# Patient Record
Sex: Female | Born: 1935 | Race: White | Hispanic: No | Marital: Married | Smoking: Former smoker
Health system: Southern US, Community
[De-identification: ages and names within clinical notes are randomized; demographics above are authoritative.]

## PROBLEM LIST (undated history)

## (undated) DIAGNOSIS — E041 Nontoxic single thyroid nodule: Secondary | ICD-10-CM

## (undated) DIAGNOSIS — Z9849 Cataract extraction status, unspecified eye: Secondary | ICD-10-CM

## (undated) DIAGNOSIS — C50919 Malignant neoplasm of unspecified site of unspecified female breast: Secondary | ICD-10-CM

## (undated) DIAGNOSIS — E7800 Pure hypercholesterolemia, unspecified: Secondary | ICD-10-CM

## (undated) DIAGNOSIS — Z9889 Other specified postprocedural states: Secondary | ICD-10-CM

## (undated) DIAGNOSIS — J45909 Unspecified asthma, uncomplicated: Secondary | ICD-10-CM

## (undated) DIAGNOSIS — I10 Essential (primary) hypertension: Secondary | ICD-10-CM

## (undated) DIAGNOSIS — K219 Gastro-esophageal reflux disease without esophagitis: Secondary | ICD-10-CM

## (undated) DIAGNOSIS — Z9660 Presence of unspecified orthopedic joint implant: Secondary | ICD-10-CM

## (undated) DIAGNOSIS — J449 Chronic obstructive pulmonary disease, unspecified: Secondary | ICD-10-CM

## (undated) DIAGNOSIS — M8580 Other specified disorders of bone density and structure, unspecified site: Secondary | ICD-10-CM

## (undated) DIAGNOSIS — M1990 Unspecified osteoarthritis, unspecified site: Secondary | ICD-10-CM

## (undated) HISTORY — DX: Essential (primary) hypertension: I10

## (undated) HISTORY — DX: Presence of unspecified orthopedic joint implant: Z96.60

## (undated) HISTORY — DX: Gastro-esophageal reflux disease without esophagitis: K21.9

## (undated) HISTORY — DX: Other specified postprocedural states: Z98.890

## (undated) HISTORY — DX: Other specified disorders of bone density and structure, unspecified site: M85.80

## (undated) HISTORY — DX: Cataract extraction status, unspecified eye: Z98.49

## (undated) HISTORY — DX: Unspecified osteoarthritis, unspecified site: M19.90

## (undated) HISTORY — PX: APPENDECTOMY: SHX54

## (undated) HISTORY — DX: Nontoxic single thyroid nodule: E04.1

## (undated) HISTORY — DX: Unspecified asthma, uncomplicated: J45.909

## (undated) HISTORY — DX: Malignant neoplasm of unspecified site of unspecified female breast: C50.919

## (undated) HISTORY — DX: Chronic obstructive pulmonary disease, unspecified: J44.9

## (undated) HISTORY — PX: ABDOMINAL HYSTERECTOMY: SHX81

## (undated) HISTORY — DX: Pure hypercholesterolemia, unspecified: E78.00

---

## 2007-06-15 ENCOUNTER — Ambulatory Visit: Payer: Self-pay | Admitting: Internal Medicine

## 2008-07-12 ENCOUNTER — Ambulatory Visit: Payer: Self-pay | Admitting: Family Medicine

## 2008-07-18 ENCOUNTER — Ambulatory Visit: Payer: Self-pay | Admitting: Family Medicine

## 2008-07-25 ENCOUNTER — Ambulatory Visit: Payer: Self-pay | Admitting: Cardiology

## 2008-07-25 ENCOUNTER — Ambulatory Visit: Payer: Self-pay | Admitting: Unknown Physician Specialty

## 2008-08-02 ENCOUNTER — Ambulatory Visit: Payer: Self-pay | Admitting: Unknown Physician Specialty

## 2008-08-05 ENCOUNTER — Inpatient Hospital Stay: Payer: Self-pay | Admitting: Vascular Surgery

## 2008-09-14 HISTORY — PX: BREAST EXCISIONAL BIOPSY: SUR124

## 2008-09-20 ENCOUNTER — Ambulatory Visit: Payer: Self-pay | Admitting: Vascular Surgery

## 2008-09-27 ENCOUNTER — Ambulatory Visit: Payer: Self-pay | Admitting: Family Medicine

## 2009-05-02 ENCOUNTER — Ambulatory Visit: Payer: Self-pay | Admitting: Gastroenterology

## 2009-08-06 ENCOUNTER — Ambulatory Visit: Payer: Self-pay | Admitting: Family Medicine

## 2009-08-22 ENCOUNTER — Ambulatory Visit: Payer: Self-pay | Admitting: Family Medicine

## 2009-10-13 IMAGING — CR DG CHEST 1V PORT
1 series · 1 of 1 positions shown · non-contrast
Comparison: none

REASON FOR EXAM: post central line
COMMENTS:

[view not recorded]
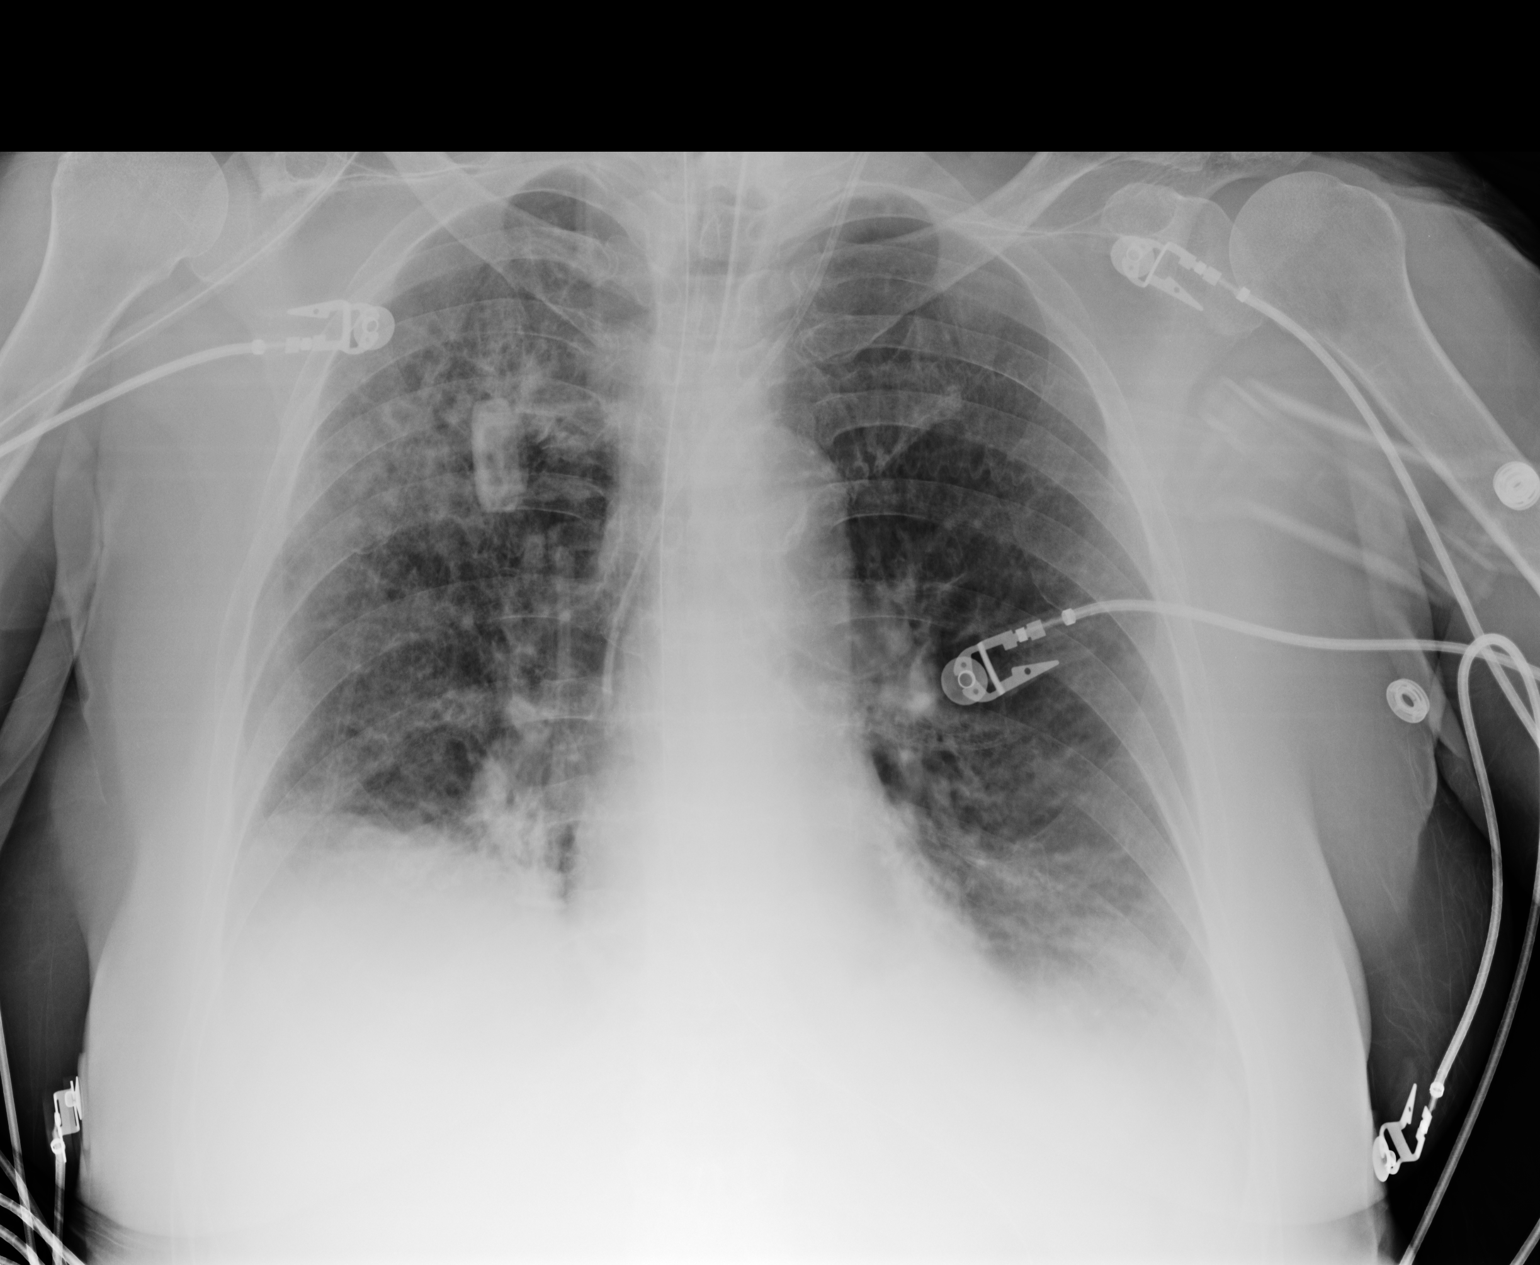

[1 of 1 positions shown; findings below may reference images not displayed]

PROCEDURE:     DXR - DXR PORTABLE CHEST SINGLE VIEW  - August 10, 2008  [DATE]

RESULT:     Comparison is made to prior study same date earlier time. In the
interim an endotracheal tube has been placed with tip at the level of the
clavicles. A LEFT sided central venous catheter is appreciated with tip
projected in the region of the superior vena cava. An NG tube is seen with
tip not on the view of this study.  There is diffuse thickening of the
interstitial markings as well as peribronchial cuffing. There appears to be
pulmonary opacities within the RIGHT hemithorax and to a lesser extent the
LEFT. Areas of increased density project within the RIGHT and LEFT lung
bases with blunting of the costophrenic angles. The cardiac silhouette is
within normal limits.
IMPRESSION: 1. Diffuse pulmonary infiltrate as well as possibly an element of
non-cardiogenic pulmonary edema. Support lines and tubes as described above.
Atelectasis versus infiltrate versus asymmetric edema within the lung bases.
 Continued surveillance evaluation is recommended.

2. No evidence of pneumothorax status post central venous catheter placement.

## 2009-10-14 IMAGING — CR DG CHEST 1V PORT
1 series · 1 of 1 positions shown · non-contrast
Comparison: none

REASON FOR EXAM: vent
COMMENTS:

[view not recorded]
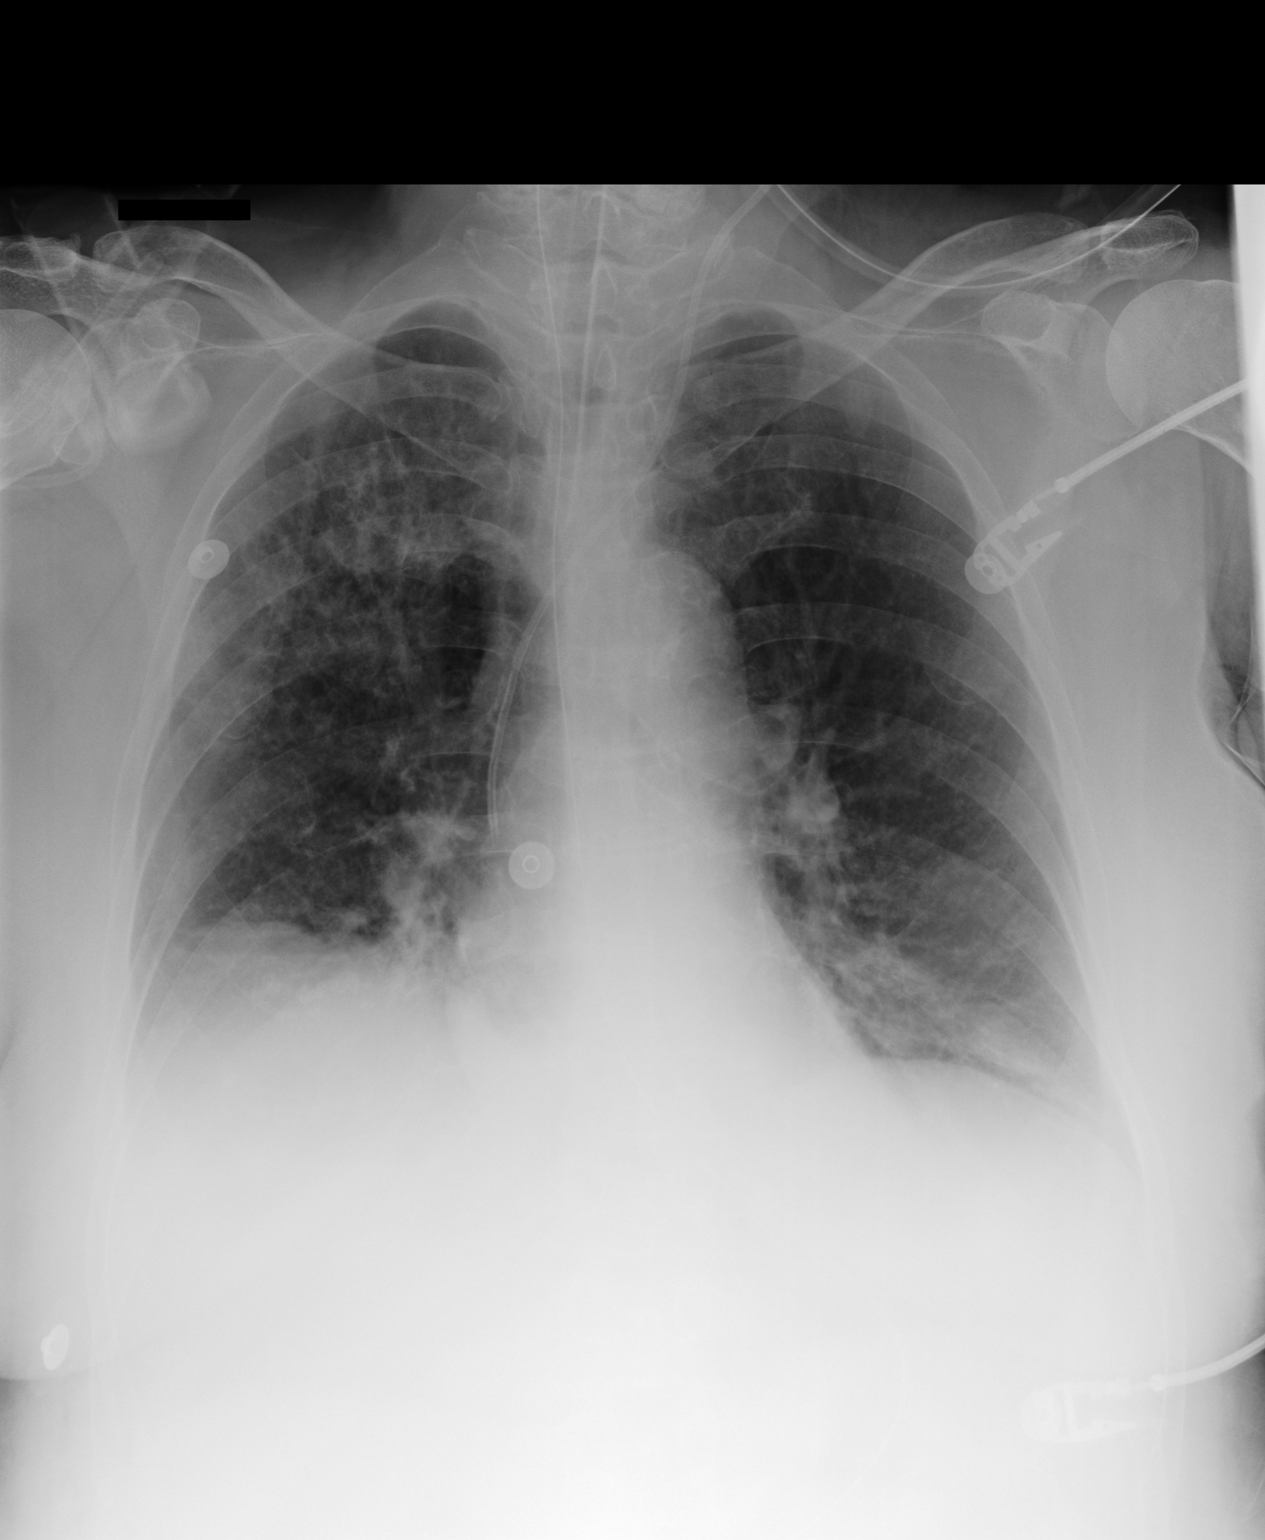

[1 of 1 positions shown; findings below may reference images not displayed]

PROCEDURE:     DXR - DXR PORTABLE CHEST SINGLE VIEW  - August 11, 2008  [DATE]

RESULT:     Frontal view of the chest is obtained.

Comparison is made to a prior study dated 08/10/2008.

There is persistent diffuse thickening of the interstitial markings and
pulmonary opacities within the RIGHT upper lobe which appear to have
slightly decreased in conspicuity when compared to the previous study and
likely secondary to improved aeration. No new focal regions of consolidation
are appreciated. A LEFT sided central venous catheter is appreciated with
the tip in the region of the superior vena cava/RIGHT atrial junction. NG
tube is seen with the tip not on the view of the study. Endotracheal tube is
seen with the tip at the level of the clavicles.
IMPRESSION: 1.     Slight decreased conspicuity of the bilateral pulmonary opacities
likely secondary to improved aeration. No new focal regions of consolidation
or focal infiltrates are appreciated.
2.     Support lines and tubes as described above.

## 2009-10-15 ENCOUNTER — Ambulatory Visit: Payer: Self-pay | Admitting: Radiation Oncology

## 2009-11-04 ENCOUNTER — Ambulatory Visit: Payer: Self-pay | Admitting: Radiation Oncology

## 2009-11-12 ENCOUNTER — Ambulatory Visit: Payer: Self-pay | Admitting: Radiation Oncology

## 2009-11-14 ENCOUNTER — Ambulatory Visit: Payer: Self-pay | Admitting: Radiation Oncology

## 2009-12-13 ENCOUNTER — Ambulatory Visit: Payer: Self-pay | Admitting: Radiation Oncology

## 2010-01-12 ENCOUNTER — Ambulatory Visit: Payer: Self-pay | Admitting: Radiation Oncology

## 2010-02-12 ENCOUNTER — Ambulatory Visit: Payer: Self-pay | Admitting: Radiation Oncology

## 2010-02-12 ENCOUNTER — Ambulatory Visit: Payer: Self-pay | Admitting: Internal Medicine

## 2010-03-14 ENCOUNTER — Ambulatory Visit: Payer: Self-pay | Admitting: Radiation Oncology

## 2010-03-14 ENCOUNTER — Ambulatory Visit: Payer: Self-pay | Admitting: Internal Medicine

## 2010-03-28 LAB — CANCER ANTIGEN 27.29: CA 27.29: 9.7 U/mL (ref 0.0–38.6)

## 2010-04-14 ENCOUNTER — Ambulatory Visit: Payer: Self-pay | Admitting: Internal Medicine

## 2010-04-30 ENCOUNTER — Encounter: Payer: Self-pay | Admitting: Family Medicine

## 2010-05-01 ENCOUNTER — Ambulatory Visit: Payer: Self-pay | Admitting: Radiation Oncology

## 2010-05-15 ENCOUNTER — Ambulatory Visit: Payer: Self-pay | Admitting: Radiation Oncology

## 2010-05-15 ENCOUNTER — Encounter: Payer: Self-pay | Admitting: Family Medicine

## 2010-07-16 ENCOUNTER — Encounter: Payer: Self-pay | Admitting: Orthopedic Surgery

## 2010-07-30 ENCOUNTER — Ambulatory Visit: Payer: Self-pay | Admitting: Radiation Oncology

## 2010-08-14 ENCOUNTER — Ambulatory Visit: Payer: Self-pay | Admitting: Radiation Oncology

## 2010-08-14 ENCOUNTER — Encounter: Payer: Self-pay | Admitting: Orthopedic Surgery

## 2010-08-15 LAB — CANCER ANTIGEN 27.29: CA 27.29: 13.4 U/mL (ref 0.0–38.6)

## 2010-09-14 ENCOUNTER — Ambulatory Visit: Payer: Self-pay | Admitting: Radiation Oncology

## 2010-10-28 ENCOUNTER — Ambulatory Visit: Payer: Self-pay | Admitting: Internal Medicine

## 2010-11-24 ENCOUNTER — Encounter: Payer: Self-pay | Admitting: Family Medicine

## 2010-12-14 ENCOUNTER — Encounter: Payer: Self-pay | Admitting: Family Medicine

## 2010-12-24 ENCOUNTER — Ambulatory Visit: Payer: Self-pay | Admitting: Internal Medicine

## 2011-01-13 ENCOUNTER — Ambulatory Visit: Payer: Self-pay | Admitting: Internal Medicine

## 2011-02-05 ENCOUNTER — Ambulatory Visit: Payer: Self-pay | Admitting: Family Medicine

## 2011-04-28 ENCOUNTER — Ambulatory Visit: Payer: Self-pay | Admitting: Internal Medicine

## 2011-05-16 ENCOUNTER — Ambulatory Visit: Payer: Self-pay | Admitting: Internal Medicine

## 2011-06-15 ENCOUNTER — Ambulatory Visit: Payer: Self-pay | Admitting: Internal Medicine

## 2011-08-21 ENCOUNTER — Encounter: Payer: Self-pay | Admitting: Family Medicine

## 2011-09-19 ENCOUNTER — Ambulatory Visit: Payer: Self-pay

## 2011-09-23 ENCOUNTER — Emergency Department: Payer: Self-pay | Admitting: Emergency Medicine

## 2011-09-23 LAB — COMPREHENSIVE METABOLIC PANEL
Albumin: 4.2 g/dL (ref 3.4–5.0)
Anion Gap: 11 (ref 7–16)
Calcium, Total: 9.8 mg/dL (ref 8.5–10.1)
Chloride: 91 mmol/L — ABNORMAL LOW (ref 98–107)
Creatinine: 0.59 mg/dL — ABNORMAL LOW (ref 0.60–1.30)
EGFR (African American): >60
Glucose: 119 mg/dL — ABNORMAL HIGH (ref 65–99)
Potassium: 3.7 mmol/L (ref 3.5–5.1)
Sodium: 131 mmol/L — ABNORMAL LOW (ref 136–145)
Total Protein: 7.8 g/dL (ref 6.4–8.2)

## 2011-09-23 LAB — CBC
HCT: 45.4 % (ref 35.0–47.0)
MCH: 33.4 pg (ref 26.0–34.0)
MCHC: 33.8 g/dL (ref 32.0–36.0)
MCV: 99 fL (ref 80–100)
Platelet: 276 10*3/uL (ref 150–440)
RBC: 4.6 10*6/uL (ref 3.80–5.20)
RDW: 12.6 % (ref 11.5–14.5)
WBC: 11 10*3/uL (ref 3.6–11.0)

## 2011-09-23 LAB — CK TOTAL AND CKMB (NOT AT ARMC): CK, Total: 65 U/L (ref 21–215)

## 2011-09-29 LAB — CULTURE, BLOOD (SINGLE)

## 2011-10-06 ENCOUNTER — Encounter: Payer: Self-pay | Admitting: Family Medicine

## 2011-11-12 ENCOUNTER — Ambulatory Visit: Payer: Self-pay | Admitting: Oncology

## 2011-11-12 LAB — CBC CANCER CENTER
Basophil %: 0.9 %
Eosinophil %: 3.6 %
HCT: 42.2 % (ref 35.0–47.0)
HGB: 14.4 g/dL (ref 12.0–16.0)
Lymphocyte #: 1.2 x10 3/mm (ref 1.0–3.6)
Lymphocyte %: 15.1 %
MCH: 32.7 pg (ref 26.0–34.0)
Monocyte %: 10.1 %
Neutrophil %: 70.3 %
RBC: 4.41 10*6/uL (ref 3.80–5.20)
WBC: 8.1 x10 3/mm (ref 3.6–11.0)

## 2011-11-12 LAB — COMPREHENSIVE METABOLIC PANEL
Albumin: 4.1 g/dL (ref 3.4–5.0)
Alkaline Phosphatase: 78 U/L (ref 50–136)
BUN: 16 mg/dL (ref 7–18)
Bilirubin,Total: 0.3 mg/dL (ref 0.2–1.0)
Chloride: 97 mmol/L — ABNORMAL LOW (ref 98–107)
Creatinine: 0.66 mg/dL (ref 0.60–1.30)
EGFR (Non-African Amer.): >60
Glucose: 97 mg/dL (ref 65–99)
Osmolality: 271 (ref 275–301)
SGOT(AST): 19 U/L (ref 15–37)
SGPT (ALT): 15 U/L
Total Protein: 7.6 g/dL (ref 6.4–8.2)

## 2011-11-13 ENCOUNTER — Ambulatory Visit: Payer: Self-pay | Admitting: Oncology

## 2011-12-14 ENCOUNTER — Ambulatory Visit: Payer: Self-pay | Admitting: Oncology

## 2012-03-03 ENCOUNTER — Ambulatory Visit: Payer: Self-pay | Admitting: Oncology

## 2012-06-10 ENCOUNTER — Ambulatory Visit: Payer: Self-pay | Admitting: Oncology

## 2012-06-11 LAB — CANCER ANTIGEN 27.29: CA 27.29: 8.8 U/mL (ref 0.0–38.6)

## 2012-06-14 ENCOUNTER — Ambulatory Visit: Payer: Self-pay | Admitting: Oncology

## 2012-09-14 DIAGNOSIS — Z9660 Presence of unspecified orthopedic joint implant: Secondary | ICD-10-CM

## 2012-09-14 HISTORY — DX: Presence of unspecified orthopedic joint implant: Z96.60

## 2012-11-21 ENCOUNTER — Ambulatory Visit: Payer: Self-pay | Admitting: Oncology

## 2012-11-24 ENCOUNTER — Ambulatory Visit: Payer: Self-pay | Admitting: Oncology

## 2012-11-25 LAB — CBC CANCER CENTER
Basophil %: 1.2 %
Eosinophil %: 4.5 %
HCT: 35.6 % (ref 35.0–47.0)
Lymphocyte #: 1.6 x10 3/mm (ref 1.0–3.6)
Lymphocyte %: 18.6 %
MCH: 28.7 pg (ref 26.0–34.0)
MCHC: 32.6 g/dL (ref 32.0–36.0)
MCV: 88 fL (ref 80–100)
Monocyte #: 1 x10 3/mm — ABNORMAL HIGH (ref 0.2–0.9)
Neutrophil %: 64.2 %
Platelet: 318 x10 3/mm (ref 150–440)
RBC: 4.05 10*6/uL (ref 3.80–5.20)
WBC: 8.9 x10 3/mm (ref 3.6–11.0)

## 2012-11-25 LAB — PROTIME-INR: INR: 1

## 2012-12-13 ENCOUNTER — Ambulatory Visit: Payer: Self-pay | Admitting: Oncology

## 2013-03-06 ENCOUNTER — Ambulatory Visit: Payer: Self-pay | Admitting: Oncology

## 2013-05-10 ENCOUNTER — Ambulatory Visit: Payer: Self-pay | Admitting: Oncology

## 2013-05-11 LAB — CANCER ANTIGEN 27.29: CA 27.29: 8.8 U/mL (ref 0.0–38.6)

## 2013-05-15 ENCOUNTER — Ambulatory Visit: Payer: Self-pay | Admitting: Oncology

## 2013-12-14 ENCOUNTER — Ambulatory Visit: Payer: Self-pay | Admitting: Oncology

## 2013-12-15 ENCOUNTER — Ambulatory Visit: Payer: Self-pay | Admitting: Oncology

## 2013-12-25 ENCOUNTER — Ambulatory Visit: Payer: Self-pay | Admitting: General Surgery

## 2014-01-15 ENCOUNTER — Ambulatory Visit: Payer: Self-pay | Admitting: Oncology

## 2014-01-16 LAB — PATHOLOGY REPORT

## 2014-01-18 HISTORY — PX: BREAST BIOPSY: SHX20

## 2014-01-25 ENCOUNTER — Ambulatory Visit: Payer: Self-pay | Admitting: Oncology

## 2014-01-26 LAB — CANCER ANTIGEN 27.29: CA 27.29: 13.5 U/mL (ref 0.0–38.6)

## 2014-02-12 ENCOUNTER — Ambulatory Visit: Payer: Self-pay | Admitting: Oncology

## 2014-03-14 ENCOUNTER — Ambulatory Visit: Payer: Self-pay | Admitting: Oncology

## 2014-08-08 ENCOUNTER — Ambulatory Visit: Payer: Self-pay | Admitting: Oncology

## 2014-08-08 LAB — HEPATIC FUNCTION PANEL A (ARMC)
AST: 19 U/L (ref 15–37)
Albumin: 3.9 g/dL (ref 3.4–5.0)
Alkaline Phosphatase: 52 U/L
BILIRUBIN TOTAL: 0.4 mg/dL (ref 0.2–1.0)
Bilirubin, Direct: 0.1 mg/dL (ref 0.0–0.2)
SGPT (ALT): 10 U/L — ABNORMAL LOW
TOTAL PROTEIN: 7 g/dL (ref 6.4–8.2)

## 2014-08-08 LAB — LACTATE DEHYDROGENASE: LDH: 158 U/L (ref 81–246)

## 2014-08-13 LAB — CANCER ANTIGEN 27.29: CA 27.29: <3.5 U/mL (ref 0.0–38.6)

## 2014-08-14 ENCOUNTER — Ambulatory Visit: Payer: Self-pay | Admitting: Oncology

## 2014-11-07 DIAGNOSIS — Z9889 Other specified postprocedural states: Secondary | ICD-10-CM

## 2014-11-07 HISTORY — DX: Other specified postprocedural states: Z98.890

## 2014-12-25 ENCOUNTER — Ambulatory Visit
Admit: 2014-12-25 | Disposition: A | Discharge status: Home / Self Care | Payer: Self-pay | Attending: Otolaryngology | Admitting: Otolaryngology

## 2015-01-22 ENCOUNTER — Other Ambulatory Visit: Payer: Self-pay | Admitting: Oncology

## 2015-01-22 DIAGNOSIS — C50919 Malignant neoplasm of unspecified site of unspecified female breast: Secondary | ICD-10-CM | POA: Insufficient documentation

## 2015-01-22 DIAGNOSIS — C50912 Malignant neoplasm of unspecified site of left female breast: Secondary | ICD-10-CM

## 2015-01-25 ENCOUNTER — Ambulatory Visit: Payer: Self-pay | Admitting: Oncology

## 2015-01-25 ENCOUNTER — Other Ambulatory Visit: Payer: Self-pay

## 2015-02-01 ENCOUNTER — Inpatient Hospital Stay (HOSPITAL_BASED_OUTPATIENT_CLINIC_OR_DEPARTMENT_OTHER): Payer: Medicare Other | Admitting: Oncology

## 2015-02-01 ENCOUNTER — Inpatient Hospital Stay: Payer: Medicare Other | Attending: Oncology

## 2015-02-01 ENCOUNTER — Encounter: Payer: Self-pay | Admitting: Oncology

## 2015-02-01 VITALS — BP 191/83 | HR 82 | Temp 97.0°F | Resp 18 | Wt 132.7 lb

## 2015-02-01 DIAGNOSIS — Z79899 Other long term (current) drug therapy: Secondary | ICD-10-CM | POA: Diagnosis not present

## 2015-02-01 DIAGNOSIS — Z170 Estrogen receptor positive status [ER+]: Secondary | ICD-10-CM

## 2015-02-01 DIAGNOSIS — I10 Essential (primary) hypertension: Secondary | ICD-10-CM | POA: Diagnosis not present

## 2015-02-01 DIAGNOSIS — J449 Chronic obstructive pulmonary disease, unspecified: Secondary | ICD-10-CM | POA: Insufficient documentation

## 2015-02-01 DIAGNOSIS — Z87891 Personal history of nicotine dependence: Secondary | ICD-10-CM | POA: Insufficient documentation

## 2015-02-01 DIAGNOSIS — C50912 Malignant neoplasm of unspecified site of left female breast: Secondary | ICD-10-CM

## 2015-02-01 DIAGNOSIS — K219 Gastro-esophageal reflux disease without esophagitis: Secondary | ICD-10-CM | POA: Diagnosis not present

## 2015-02-01 DIAGNOSIS — Z79811 Long term (current) use of aromatase inhibitors: Secondary | ICD-10-CM | POA: Diagnosis not present

## 2015-02-01 DIAGNOSIS — M818 Other osteoporosis without current pathological fracture: Secondary | ICD-10-CM

## 2015-02-01 DIAGNOSIS — E780 Pure hypercholesterolemia: Secondary | ICD-10-CM | POA: Diagnosis not present

## 2015-02-02 LAB — CANCER ANTIGEN 27.29: CA 27.29: 19 U/mL (ref 0.0–38.6)

## 2015-02-15 NOTE — Progress Notes (Signed)
[place]  [phone]) [phone] [phone]) [phone]  ID: Amanda Calhoun OB: Mar 15, 1935  MR#: [id number]  XGZ#:[id number]  No care team member to display  CHIEF COMPLAINT:  Chief Complaint  Patient presents with  . Follow-up    breast cancer    INTERVAL HISTORY: Patient returns to clinic today for routine 6 month evaluation.  She continues to feel well and remains asymptomatic.  She continues to tolerate tamoxifen without significant side effects.  She has had no recent fevers or illnesses.  She denies any weight loss.  She denies any pain.  She has no neurologic complaints.  She denies any chest pain or shortness of breath.  She denies any nausea, vomiting, constipation, or diarrhea.  She has no urinary complaints.  Patient offers no specific complaints today.   REVIEW OF SYSTEMS:   Review of Systems  Constitutional: Negative.     As per HPI. Otherwise, a complete review of systems is negatve.  PAST MEDICAL HISTORY: Past Medical History  Diagnosis Date  . GERD (gastroesophageal reflux disease)   . Hypertension   . COPD (chronic obstructive pulmonary disease)   . Breast cancer   . Pure hypercholesterolemia   . Arthritis   . Osteopenia   . Asthma   . Thyroid nodule   . History of back surgery     2012; 2015  . Hx of abdominal surgery     small intestine resection (gangrenous)  . Hx of joint replacement 2014  . H/O cataract extraction 04/2014, 05/2014  . H/O colonoscopy 11/07/14    PAST SURGICAL HISTORY: Past Surgical History  Procedure Laterality Date  . Appendectomy    . Abdominal hysterectomy      FAMILY HISTORY Family History  Problem Relation Age of Onset  . Dementia Mother   . Alzheimer's disease Mother   . Hypertension Mother   . Breast cancer Mother   . Hypertension Father   . Dementia Father   . Alzheimer's disease Father   . Breast cancer Sister        ADVANCED DIRECTIVES:    HEALTH MAINTENANCE: History  Substance  Use Topics  . Smoking status: Former Smoker    Types: Cigarettes    Quit date: 09/22/1986  . Smokeless tobacco: Never Used  . Alcohol Use: No     Colonoscopy:  PAP:  Bone density:  Lipid panel:  Allergies  Allergen Reactions  . Sulfa Antibiotics   . Penicillins Rash    'measles-like'    Current Outpatient Prescriptions  Medication Sig Dispense Refill  . alendronate (FOSAMAX) 70 MG tablet     . aspirin 81 MG chewable tablet Chew by mouth.    . budesonide-formoterol (SYMBICORT) 80-4.5 MCG/ACT inhaler Inhale into the lungs.    Neta Ehlers NATURAL ZINC 50 MG TABS Take by mouth.    Marland Kitchen lisinopril (PRINIVIL,ZESTRIL) 30 MG tablet     . lovastatin (MEVACOR) 40 MG tablet Take by mouth.    . naproxen sodium (ANAPROX) 220 MG tablet Take by mouth.    . oxybutynin (DITROPAN) 5 MG tablet     . pantoprazole (PROTONIX) 40 MG tablet     . SPIRIVA HANDIHALER 18 MCG inhalation capsule      No current facility-administered medications for this visit.    OBJECTIVE: Filed Vitals:   02/01/15 1200  BP: 191/83  Pulse: 82  Temp: 97 F (36.1 C)  Resp: 18     There is no height on file to calculate BMI.  ECOG FS:0 - Asymptomatic  General: Well-developed, well-nourished, no acute distress. Eyes: anicteric sclera. Breasts: Bilateral breast and axilla without lumps or masses. Lungs: Clear to auscultation bilaterally. Heart: Regular rate and rhythm. No rubs, murmurs, or gallops. Abdomen: Soft, nontender, nondistended. No organomegaly noted, normoactive bowel sounds. Musculoskeletal: No edema, cyanosis, or clubbing. Neuro: Alert, answering all questions appropriately. Cranial nerves grossly intact. Skin: No rashes or petechiae noted. Psych: Normal affect.  LAB RESULTS:  Lab Results  Component Value Date   NA 135* 11/12/2011   K 3.6 11/12/2011   CL 97* 11/12/2011   CO2 28 11/12/2011   GLUCOSE 97 11/12/2011   BUN 16 11/12/2011   CREATININE 0.66 11/12/2011   CALCIUM 9.2 11/12/2011   PROT  7.0 08/08/2014   ALBUMIN 3.9 08/08/2014   AST 19 08/08/2014   ALT 10* 08/08/2014   ALKPHOS 52 08/08/2014    Lab Results  Component Value Date   WBC 8.9 11/25/2012   NEUTROABS 5.7 11/25/2012   HGB 11.6* 11/25/2012   HCT 35.6 11/25/2012   MCV 88 11/25/2012   PLT 318 11/25/2012     STUDIES: No results found.  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  Breast cancer: No evidence of disease.  Continue tamoxifen completing 5 years of treatment in August 2016. She had a recent mammogram that was reported as BI-RADS 4, but follow up biopsy was negative.  No intervention was needed.  Repeat mammogram in May of 2016. Patient will return to clinic in 1 year routine evaluation.   2.  Osteoporosis: Bone mineral density in June 2015 revealed osteoporosisis but significantly improved over 1 year prior. Continue Fosamax, calcium, and vitamin D.  Continue monitoring by patient's PCP.  Patient expressed understanding and was in agreement with this plan. She also understands that She can call clinic at any time with any questions, concerns, or complaints.   Breast cancer   Staging form: Breast, AJCC 7th Edition     Clinical stage from 01/22/2015: Stage IA (T1a, N0, M0) - Signed by Lloyd Huger, MD on 01/22/2015   Lloyd Huger, MD   02/15/2015 4:14 PM

## 2015-08-14 ENCOUNTER — Ambulatory Visit
Admission: RE | Admit: 2015-08-14 | Discharge: 2015-08-14 | Disposition: A | Discharge status: Home / Self Care | Payer: Medicare Other | Source: Ambulatory Visit | Attending: Oncology | Admitting: Oncology

## 2015-08-14 ENCOUNTER — Other Ambulatory Visit: Payer: Self-pay | Admitting: Oncology

## 2015-08-14 DIAGNOSIS — C50912 Malignant neoplasm of unspecified site of left female breast: Secondary | ICD-10-CM

## 2015-08-14 DIAGNOSIS — Z853 Personal history of malignant neoplasm of breast: Secondary | ICD-10-CM | POA: Insufficient documentation

## 2015-12-21 ENCOUNTER — Emergency Department: Payer: Medicare Other

## 2015-12-21 ENCOUNTER — Emergency Department
Admission: EM | Admit: 2015-12-21 | Discharge: 2015-12-21 | Disposition: A | Discharge status: Home / Self Care | Payer: Medicare Other | Attending: Emergency Medicine | Admitting: Emergency Medicine

## 2015-12-21 ENCOUNTER — Encounter: Payer: Self-pay | Admitting: Emergency Medicine

## 2015-12-21 DIAGNOSIS — J449 Chronic obstructive pulmonary disease, unspecified: Secondary | ICD-10-CM | POA: Insufficient documentation

## 2015-12-21 DIAGNOSIS — Y999 Unspecified external cause status: Secondary | ICD-10-CM | POA: Diagnosis not present

## 2015-12-21 DIAGNOSIS — W01198A Fall on same level from slipping, tripping and stumbling with subsequent striking against other object, initial encounter: Secondary | ICD-10-CM | POA: Insufficient documentation

## 2015-12-21 DIAGNOSIS — Z7982 Long term (current) use of aspirin: Secondary | ICD-10-CM | POA: Insufficient documentation

## 2015-12-21 DIAGNOSIS — Z87891 Personal history of nicotine dependence: Secondary | ICD-10-CM | POA: Insufficient documentation

## 2015-12-21 DIAGNOSIS — I10 Essential (primary) hypertension: Secondary | ICD-10-CM | POA: Diagnosis not present

## 2015-12-21 DIAGNOSIS — K219 Gastro-esophageal reflux disease without esophagitis: Secondary | ICD-10-CM | POA: Diagnosis not present

## 2015-12-21 DIAGNOSIS — Y929 Unspecified place or not applicable: Secondary | ICD-10-CM | POA: Diagnosis not present

## 2015-12-21 DIAGNOSIS — Z853 Personal history of malignant neoplasm of breast: Secondary | ICD-10-CM | POA: Insufficient documentation

## 2015-12-21 DIAGNOSIS — Y93A1 Activity, exercise machines primarily for cardiorespiratory conditioning: Secondary | ICD-10-CM | POA: Insufficient documentation

## 2015-12-21 DIAGNOSIS — J45909 Unspecified asthma, uncomplicated: Secondary | ICD-10-CM | POA: Insufficient documentation

## 2015-12-21 DIAGNOSIS — R52 Pain, unspecified: Secondary | ICD-10-CM

## 2015-12-21 DIAGNOSIS — M545 Low back pain: Secondary | ICD-10-CM | POA: Diagnosis present

## 2015-12-21 DIAGNOSIS — M5136 Other intervertebral disc degeneration, lumbar region: Secondary | ICD-10-CM | POA: Diagnosis not present

## 2015-12-21 DIAGNOSIS — E7800 Pure hypercholesterolemia, unspecified: Secondary | ICD-10-CM | POA: Insufficient documentation

## 2015-12-21 DIAGNOSIS — R262 Difficulty in walking, not elsewhere classified: Secondary | ICD-10-CM

## 2015-12-21 DIAGNOSIS — W19XXXA Unspecified fall, initial encounter: Secondary | ICD-10-CM

## 2015-12-21 LAB — COMPREHENSIVE METABOLIC PANEL
ALK PHOS: 74 U/L (ref 38–126)
ALT: 14 U/L (ref 14–54)
AST: 26 U/L (ref 15–41)
Albumin: 3.6 g/dL (ref 3.5–5.0)
Anion gap: 4 — ABNORMAL LOW (ref 5–15)
BILIRUBIN TOTAL: 0.8 mg/dL (ref 0.3–1.2)
BUN: 10 mg/dL (ref 6–20)
CALCIUM: 9 mg/dL (ref 8.9–10.3)
CO2: 26 mmol/L (ref 22–32)
Chloride: 105 mmol/L (ref 101–111)
Creatinine, Ser: 0.51 mg/dL (ref 0.44–1.00)
GFR calc Af Amer: >60 mL/min (ref >60)
Glucose, Bld: 128 mg/dL — ABNORMAL HIGH (ref 65–99)
POTASSIUM: 4 mmol/L (ref 3.5–5.1)
Sodium: 135 mmol/L (ref 135–145)
TOTAL PROTEIN: 6.3 g/dL — AB (ref 6.5–8.1)

## 2015-12-21 LAB — CBC WITH DIFFERENTIAL/PLATELET
BASOS ABS: 0.1 10*3/uL (ref 0–0.1)
Basophils Relative: 1 %
Eosinophils Absolute: 0.4 10*3/uL (ref 0–0.7)
Eosinophils Relative: 4 %
HEMATOCRIT: 41.4 % (ref 35.0–47.0)
HEMOGLOBIN: 14.1 g/dL (ref 12.0–16.0)
LYMPHS PCT: 6 %
Lymphs Abs: 0.6 10*3/uL — ABNORMAL LOW (ref 1.0–3.6)
MCH: 32.8 pg (ref 26.0–34.0)
MCHC: 33.9 g/dL (ref 32.0–36.0)
MCV: 96.7 fL (ref 80.0–100.0)
MONO ABS: 0.8 10*3/uL (ref 0.2–0.9)
MONOS PCT: 8 %
NEUTROS ABS: 9 10*3/uL — AB (ref 1.4–6.5)
NEUTROS PCT: 81 %
Platelets: 167 10*3/uL (ref 150–440)
RBC: 4.28 MIL/uL (ref 3.80–5.20)
RDW: 12.8 % (ref 11.5–14.5)
WBC: 10.9 10*3/uL (ref 3.6–11.0)

## 2015-12-21 MED ORDER — PREDNISONE 10 MG (21) PO TBPK
ORAL_TABLET | ORAL | Status: AC
Start: 2015-12-21 — End: ?

## 2015-12-21 MED ORDER — DIAZEPAM 5 MG PO TABS: 5.0000 mg | ORAL_TABLET | Freq: Three times a day (TID) | ORAL | Status: AC | PRN

## 2015-12-21 MED ORDER — OXYCODONE HCL 5 MG PO TABS
5.0000 mg | ORAL_TABLET | Freq: Once | ORAL | Status: AC
  Administered 2015-12-21: 5 mg via ORAL
  Filled 2015-12-21: qty 1

## 2015-12-21 MED ORDER — OXYCODONE-ACETAMINOPHEN 7.5-325 MG PO TABS: 1.0000 | ORAL_TABLET | ORAL | Status: AC | PRN

## 2015-12-21 MED ORDER — DIAZEPAM 5 MG PO TABS
5.0000 mg | ORAL_TABLET | Freq: Once | ORAL | Status: AC
  Administered 2015-12-21: 5 mg via ORAL
  Filled 2015-12-21: qty 1

## 2015-12-21 MED ORDER — OXYCODONE-ACETAMINOPHEN 5-325 MG PO TABS
2.0000 | ORAL_TABLET | Freq: Once | ORAL | Status: AC
  Administered 2015-12-21: 2 via ORAL
  Filled 2015-12-21: qty 2

## 2015-12-21 MED ORDER — PREDNISONE 20 MG PO TABS
60.0000 mg | ORAL_TABLET | Freq: Once | ORAL | Status: AC
  Administered 2015-12-21: 60 mg via ORAL
  Filled 2015-12-21: qty 3

## 2015-12-21 NOTE — ED Notes (Signed)
Pt to ED via EMS c/o pain to lower back and sacrum/back of thighs resulting from a fall yesterday. Pt states she was exercising with a stretchy band, band broke and pt fell backward and hit her lower back on part of a treadmill. States pain has gotten worse today and she is unable to sit/stand. Pt states she has tramadol and flexeril at home and has taken them without relief.

## 2015-12-21 NOTE — ED Notes (Signed)
Patient returned from MRI.

## 2015-12-21 NOTE — ED Notes (Signed)
Discussed discharge instructions, prescriptions, and follow-up care with patient. No questions or concerns at this time. Pt stable at discharge. Pt's spouse notified of discharge per pt's request so he can meet EMS at home.

## 2015-12-21 NOTE — ED Notes (Signed)
Pt's spouse # [phone], asked to be called when pt is picked up by EMS.

## 2015-12-21 NOTE — ED Notes (Signed)
Patient transported to MRI 

## 2015-12-21 NOTE — ED Provider Notes (Signed)
[facility] Emergency Department Provider Note     Time seen: ----------------------------------------- 2:58 PM on 12/21/2015 -----------------------------------------    I have reviewed the triage vital signs and the nursing notes.   HISTORY  Chief Complaint Back Pain    HPI Amanda Calhoun is a 81 y.o. female who presents to ER after sustaining a fall on Thursday. Patient states she was using stretchy bands to exercise, the band broke and she fell backwards hitting her back and part of the treadmill. The pain has worsened, she can only walk on her tiptoes with a walker, she has radicular pain down the back of both legs to her knees. She denies any numbness, nausea and a loss of bowel or bladder function. She been taking tramadol and Flexeril home without any improvement. The pain is in her low back.   Past Medical History  Diagnosis Date  . GERD (gastroesophageal reflux disease)   . Hypertension   . COPD (chronic obstructive pulmonary disease) ([place])   . Breast cancer ([place])   . Pure hypercholesterolemia   . Arthritis   . Osteopenia   . Asthma   . Thyroid nodule   . History of back surgery     2012; 2015  . Hx of abdominal surgery     small intestine resection (gangrenous)  . Hx of joint replacement 2014  . H/O cataract extraction 04/2014, 05/2014  . H/O colonoscopy 11/07/14    Patient Active Problem List   Diagnosis Date Noted  . Breast cancer ([place]) 01/22/2015    Past Surgical History  Procedure Laterality Date  . Appendectomy    . Abdominal hysterectomy    . Breast excisional biopsy Left 2010    breast ca radiation  . Breast biopsy Left 01/18/2014    stereo fat necrosis     Allergies Sulfa antibiotics and Penicillins  Social History Social History  Substance Use Topics  . Smoking status: Former Smoker    Types: Cigarettes    Quit date: 09/22/1986  . Smokeless tobacco: Never Used  . Alcohol Use: No    Review of  Systems Constitutional: Negative for fever. Cardiovascular: Negative for chest pain. Respiratory: Negative for shortness of breath. Gastrointestinal: Negative for abdominal pain, vomiting and diarrhea. Genitourinary: Negative for dysuria. Musculoskeletal: Positive for low back pain Skin: Negative for rash. Neurological: Negative for headaches, focal weakness or numbness. Positive for difficulty walking  10-point ROS otherwise negative.  ____________________________________________   PHYSICAL EXAM:  VITAL SIGNS: ED Triage Vitals  Enc Vitals Group     BP 12/21/15 1428 167/78 mmHg     Pulse Rate 12/21/15 1428 90     Resp 12/21/15 1428 17     Temp 12/21/15 1428 98 F (36.7 C)     Temp Source 12/21/15 1428 Oral     SpO2 12/21/15 1428 96 %     Weight 12/21/15 1428 124 lb (56.246 kg)     Height 12/21/15 1428 5\' 4"  (1.626 m)     Head Cir --      Peak Flow --      Pain Score 12/21/15 1429 10     Pain Loc --      Pain Edu? --      Excl. in [place]? --     Constitutional: Alert and oriented. Well appearing and in no distress. Eyes: Conjunctivae are normal. Normal extraocular movements. Cardiovascular: Normal rate, regular rhythm. Normal and symmetric distal pulses are present in all extremities. No murmurs, rubs, or gallops.  Respiratory: Normal respiratory effort without tachypnea nor retractions. Breath sounds are clear and equal bilaterally. No wheezes/rales/rhonchi. Gastrointestinal: Soft and nontender.Normal bowel sounds Musculoskeletal: Positive cross and straight leg raise examination at 30. Radicular pain seems to be in an S1 distribution Neurologic:  Normal speech and language. No gross focal neurologic deficits are appreciated. Patient unable to walk due to pain. Reflexes appear normal, normal sensory function of the lower extremities Skin:  Skin is warm, dry and intact. No rash noted. Psychiatric: Mood and affect are normal.  ___________________________________________  ED  COURSE:  Pertinent labs & imaging results that were available during my care of the patient were reviewed by me and considered in my medical decision making (see chart for details). Patient likely with acute herniated disc, we will give prednisone and pain medication. She will likely also need MRI of her low back. ____________________________________________    LABS (pertinent positives/negatives)  Labs Reviewed  CBC WITH DIFFERENTIAL/PLATELET - Abnormal; Notable for the following:    Neutro Abs 9.0 (*)    Lymphs Abs 0.6 (*)    All other components within normal limits  COMPREHENSIVE METABOLIC PANEL - Abnormal; Notable for the following:    Glucose, Bld 128 (*)    Total Protein 6.3 (*)    Anion gap 4 (*)    All other components within normal limits    RADIOLOGY Images were viewed by me  Lumbar spine x-rays, MRI of LS spine IMPRESSION: 1. No fracture or acute finding. 2. Degenerative changes as detailed. Diffuse bone demineralization.  IMPRESSION: 1. L5 laminectomy with residual severe right and mild left foraminal stenosis at L5-S1. 2. Transitional S1 anatomy. 3. Moderate central canal stenosis at L4-5 with left greater than right subarticular narrowing. 4. Moderate foraminal stenosis at L4-5 is worse on the right. 5. Moderate left and mild right subarticular narrowing at L3-4. 6. Mild to moderate foraminal narrowing at L3-4 is worse on the left. ____________________________________________  FINAL ASSESSMENT AND PLAN  Fall, radicular low back pain, Degenerative disc disease  Plan: Patient with labs and imaging as dictated above. Patient is in no acute distress, MRI findings as dictated above. This is likely acute on chronic exacerbation of her back problems. I will give her stronger pain medicine as well as steroid taper and Valium. She is advised to have close follow-up with her orthopedic surgeon. I have offered her admission at this time for pain control,  rehabilitation and physical therapy. She would prefer to go home at this time.   Earleen Newport, MD   Earleen Newport, MD 12/21/15 [phone]

## 2015-12-21 NOTE — Discharge Instructions (Signed)
Degenerative Disk Disease Degenerative disk disease is a condition caused by the changes that occur in spinal disks as you grow older. Spinal disks are soft and compressible disks located between the bones of your spine (vertebrae). These disks act like shock absorbers. Degenerative disk disease can affect the whole spine. However, the neck and lower back are most commonly affected. Many changes can occur in the spinal disks with aging, such as:  The spinal disks may dry and shrink.  Small tears may occur in the tough, outer covering of the disk (annulus).  The disk space may become smaller due to loss of water.  Abnormal growths in the bone (spurs) may occur. This can put pressure on the nerve roots exiting the spinal canal, causing pain.  The spinal canal may become narrowed. RISK FACTORS   Being overweight.  Having a family history of degenerative disk disease.  Smoking.  There is increased risk if you are doing heavy lifting or have a sudden injury. SIGNS AND SYMPTOMS  Symptoms vary from person to person and may include:  Pain that varies in intensity. Some people have no pain, while others have severe pain. The location of the pain depends on the part of your backbone that is affected.  You will have neck or arm pain if a disk in the neck area is affected.  You will have pain in your back, buttocks, or legs if a disk in the lower back is affected.  Pain that becomes worse while bending, reaching up, or with twisting movements.  Pain that may start gradually and then get worse as time passes. It may also start after a major or minor injury.  Numbness or tingling in the arms or legs. DIAGNOSIS  Your health care provider will ask you about your symptoms and about activities or habits that may cause the pain. He or she may also ask about any injuries, diseases, or treatments you have had. Your health care provider will examine you to check for the range of movement that is  possible in the affected area, to check for strength in your extremities, and to check for sensation in the areas of the arms and legs supplied by different nerve roots. You may also have:   An X-ray of the spine.  Other imaging tests, such as MRI. TREATMENT  Your health care provider will advise you on the best plan for treatment. Treatment may include:  Medicines.  Rehabilitation exercises. HOME CARE INSTRUCTIONS   Follow proper lifting and walking techniques as advised by your health care provider.  Maintain good posture.  Exercise regularly as advised by your health care provider.  Perform relaxation exercises.  Change your sitting, standing, and sleeping habits as advised by your health care provider.  Change positions frequently.  Lose weight or maintain a healthy weight as advised by your health care provider.  Do not use any tobacco products, including cigarettes, chewing tobacco, or electronic cigarettes. If you need help quitting, ask your health care provider.  Wear supportive footwear.  Take medicines only as directed by your health care provider. SEEK MEDICAL CARE IF:   Your pain does not go away within 1-4 weeks.  You have significant appetite or weight loss. SEEK IMMEDIATE MEDICAL CARE IF:   Your pain is severe.  You notice weakness in your arms, hands, or legs.  You begin to lose control of your bladder or bowel movements.  You have fevers or night sweats. MAKE SURE YOU:   Understand these  instructions.  Will watch your condition.  Will get help right away if you are not doing well or get worse.   This information is not intended to replace advice given to you by your health care provider. Make sure you discuss any questions you have with your health care provider.   Document Released: 06/28/2007 Document Revised: 09/21/2014 Document Reviewed: 01/02/2014 Elsevier Interactive Patient Education 2016 [place] in the Home    Falls can cause injuries and can affect people from all age groups. There are many simple things that you can do to make your home safe and to help prevent falls. WHAT CAN I DO ON THE OUTSIDE OF MY HOME?  Regularly repair the edges of walkways and driveways and fix any cracks.  Remove high doorway thresholds.  Trim any shrubbery on the main path into your home.  Use bright outdoor lighting.  Clear walkways of debris and clutter, including tools and rocks.  Regularly check that handrails are securely fastened and in good repair. Both sides of any steps should have handrails.  Install guardrails along the edges of any raised decks or porches.  Have leaves, snow, and ice cleared regularly.  Use sand or salt on walkways during winter months.  In the garage, clean up any spills right away, including grease or oil spills. WHAT CAN I DO IN THE BATHROOM?  Use night lights.  Install grab bars by the toilet and in the tub and shower. Do not use towel bars as grab bars.  Use non-skid mats or decals on the floor of the tub or shower.  If you need to sit down while you are in the shower, use a plastic, non-slip stool.Marland Kitchen  Keep the floor dry. Immediately clean up any water that spills on the floor.  Remove soap buildup in the tub or shower on a regular basis.  Attach bath mats securely with double-sided non-slip rug tape.  Remove throw rugs and other tripping hazards from the floor. WHAT CAN I DO IN THE BEDROOM?  Use night lights.  Make sure that a bedside light is easy to reach.  Do not use oversized bedding that drapes onto the floor.  Have a firm chair that has side arms to use for getting dressed.  Remove throw rugs and other tripping hazards from the floor. WHAT CAN I DO IN THE KITCHEN?   Clean up any spills right away.  Avoid walking on wet floors.  Place frequently used items in easy-to-reach places.  If you need to reach for something above you, use a sturdy step  stool that has a grab bar.  Keep electrical cables out of the way.  Do not use floor polish or wax that makes floors slippery. If you have to use wax, make sure that it is non-skid floor wax.  Remove throw rugs and other tripping hazards from the floor. WHAT CAN I DO IN THE STAIRWAYS?  Do not leave any items on the stairs.  Make sure that there are handrails on both sides of the stairs. Fix handrails that are broken or loose. Make sure that handrails are as long as the stairways.  Check any carpeting to make sure that it is firmly attached to the stairs. Fix any carpet that is loose or worn.  Avoid having throw rugs at the top or bottom of stairways, or secure the rugs with carpet tape to prevent them from moving.  Make sure that you have a light switch at the  top of the stairs and the bottom of the stairs. If you do not have them, have them installed. WHAT ARE SOME OTHER FALL PREVENTION TIPS?  Wear closed-toe shoes that fit well and support your feet. Wear shoes that have rubber soles or low heels.  When you use a stepladder, make sure that it is completely opened and that the sides are firmly locked. Have someone hold the ladder while you are using it. Do not climb a closed stepladder.  Add color or contrast paint or tape to grab bars and handrails in your home. Place contrasting color strips on the first and last steps.  Use mobility aids as needed, such as canes, walkers, scooters, and crutches.  Turn on lights if it is dark. Replace any light bulbs that burn out.  Set up furniture so that there are clear paths. Keep the furniture in the same spot.  Fix any uneven floor surfaces.  Choose a carpet design that does not hide the edge of steps of a stairway.  Be aware of any and all pets.  Review your medicines with your healthcare provider. Some medicines can cause dizziness or changes in blood pressure, which increase your risk of falling. Talk with your health care provider  about other ways that you can decrease your risk of falls. This may include working with a physical therapist or trainer to improve your strength, balance, and endurance.   This information is not intended to replace advice given to you by your health care provider. Make sure you discuss any questions you have with your health care provider.   Document Released: 08/21/2002 Document Revised: 01/15/2015 Document Reviewed: 10/05/2014 Elsevier Interactive Patient Education [employer].

## 2016-01-31 ENCOUNTER — Ambulatory Visit: Payer: Medicare Other | Admitting: Oncology

## 2016-02-13 DEATH — deceased

## 2017-02-22 IMAGING — CR DG LUMBAR SPINE 2-3V
3 series · 3 of 3 positions shown · non-contrast
Comparison: None.

CLINICAL DATA: Pt to ED via EMS c/o pain to lower back post fall
yesterday. Pt states she was exercising with a stretchy band, band
broke and pt fell backward and hit her lower back on part of a
treadmill.

EXAM:
LUMBAR SPINE - 2-3 VIEW

[l-spine ap]
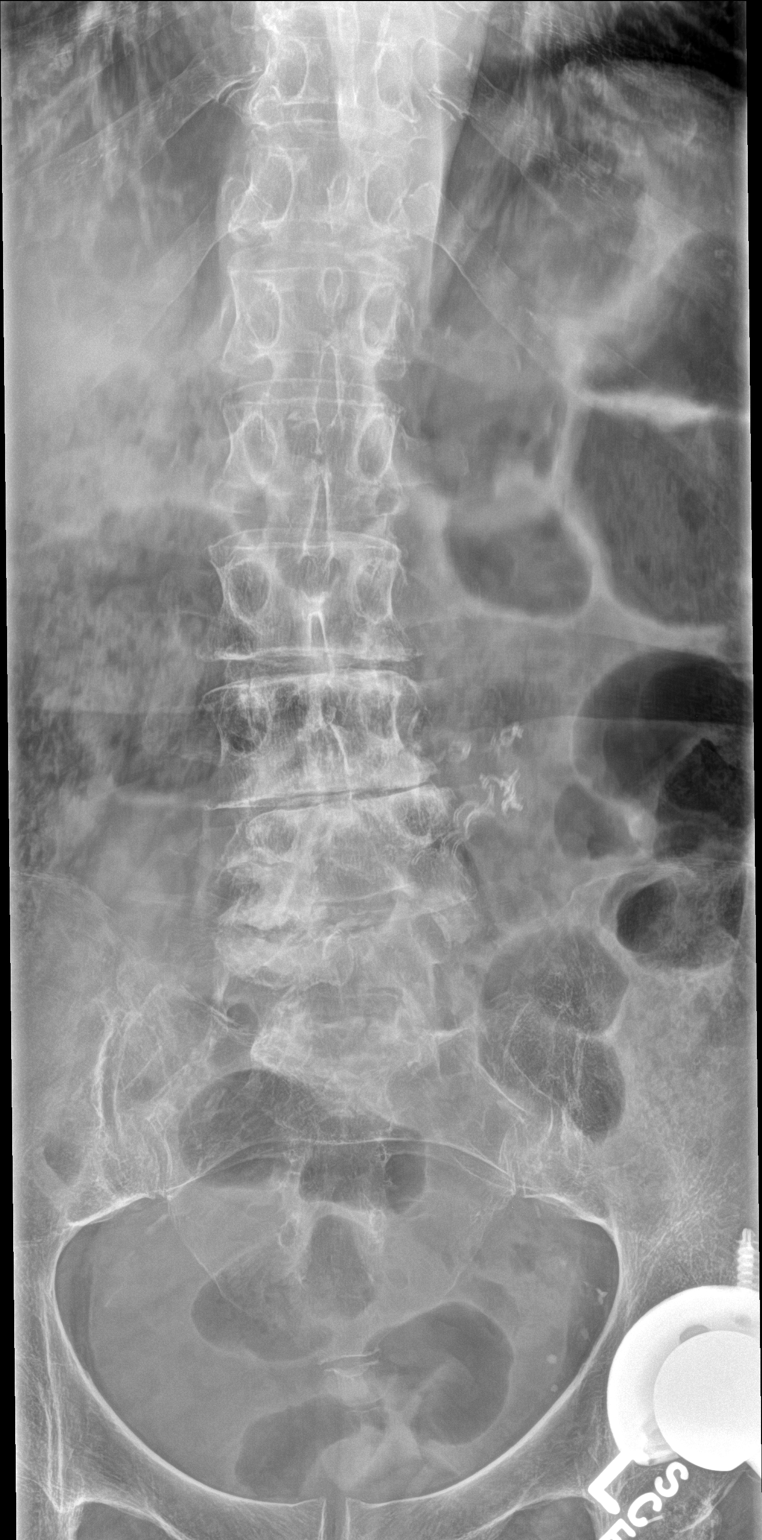

[l-spine lat]
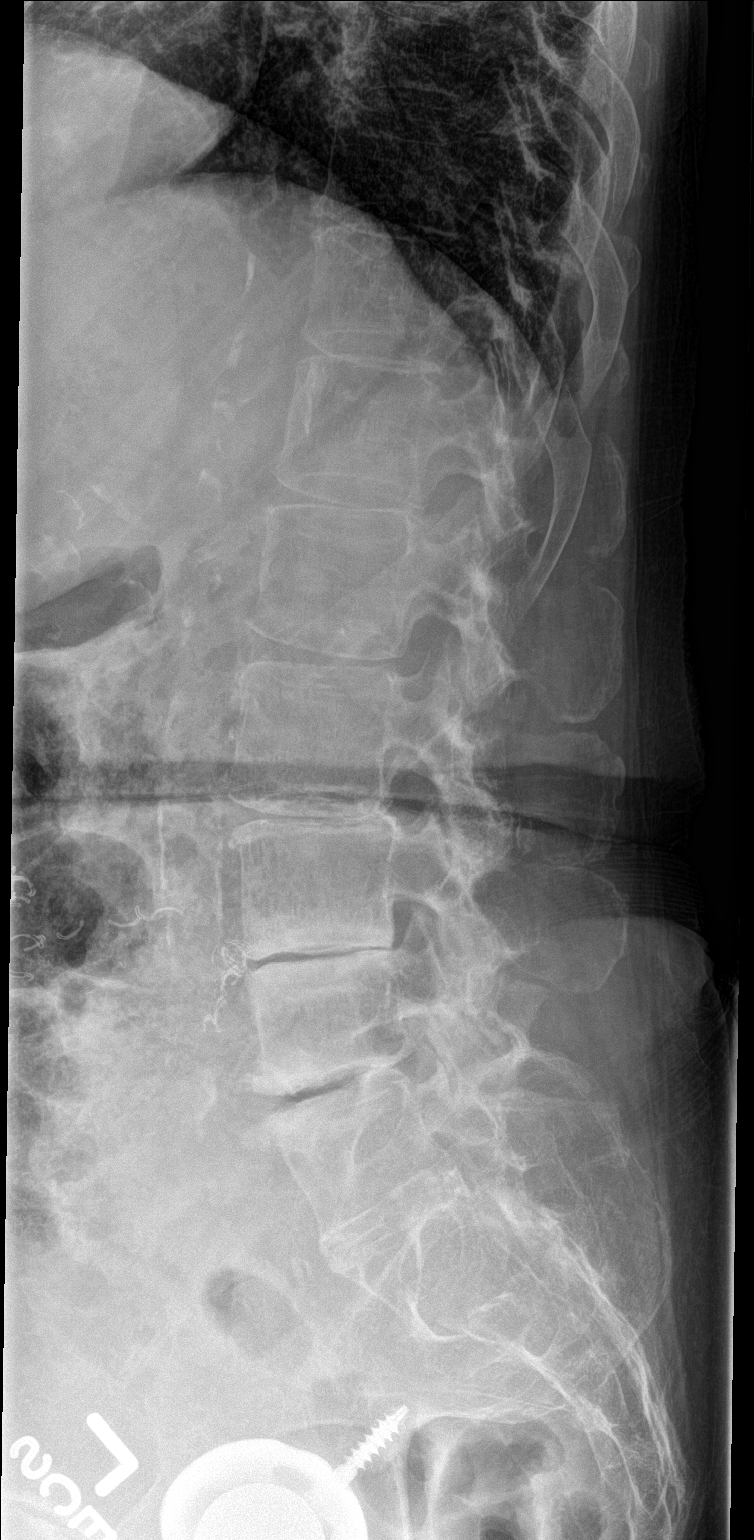

[l-spine spot]
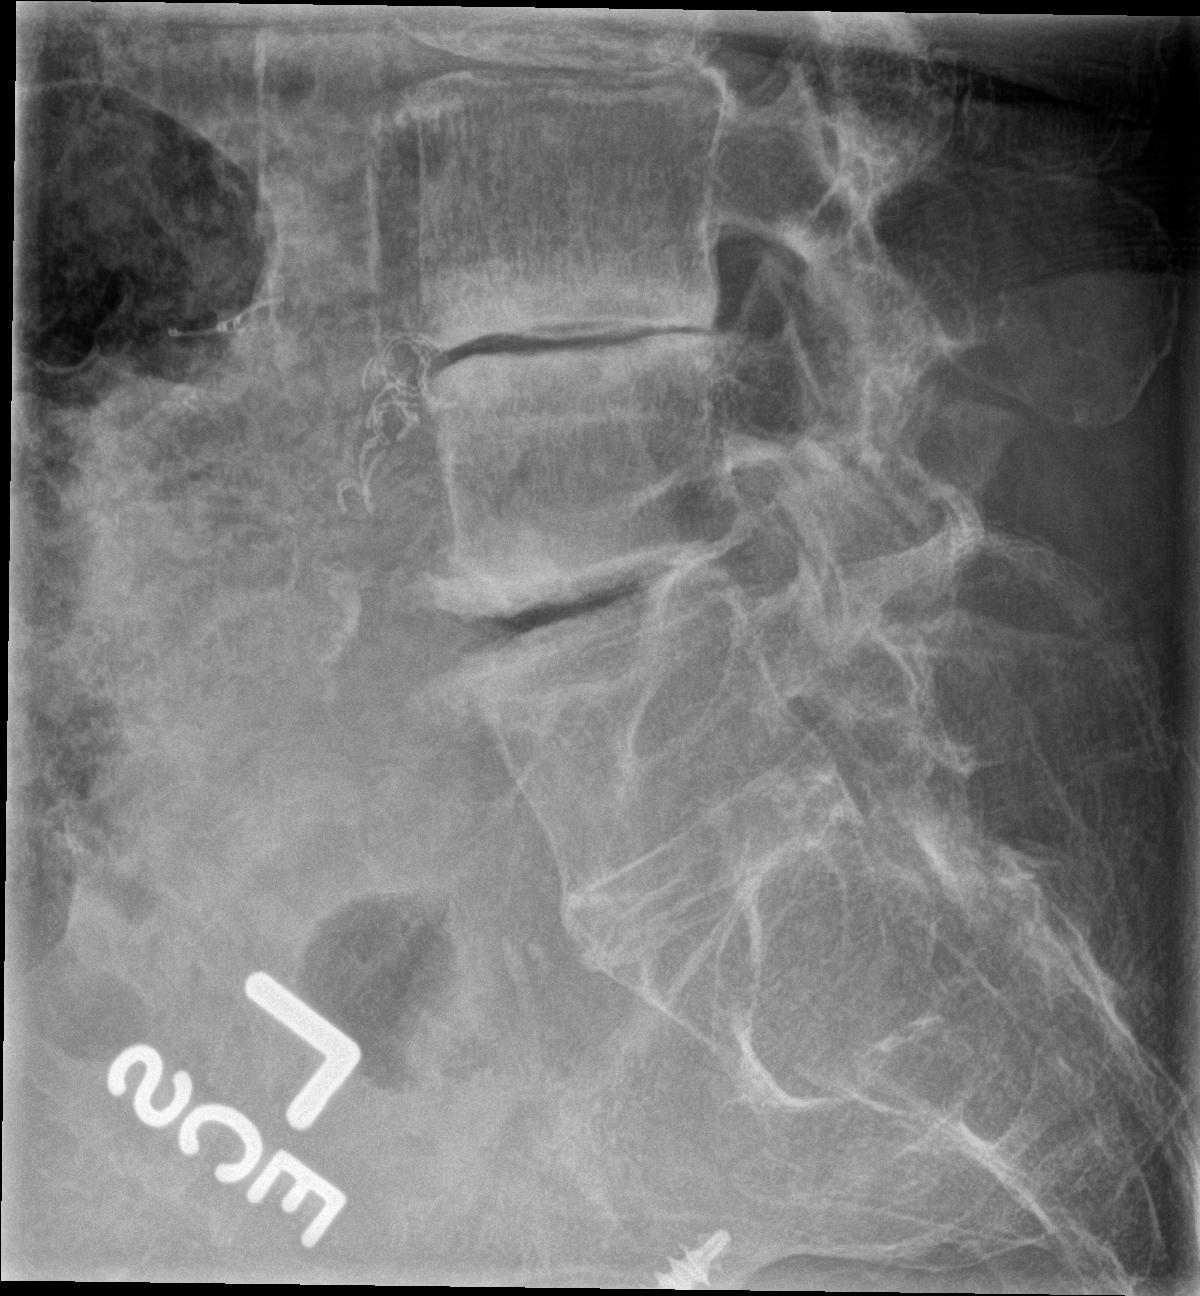

[3 of 3 positions shown; findings below may reference images not displayed]

FINDINGS: No fracture. Slight anterolisthesis of L2 on L3. No other
spondylolisthesis.

Moderate loss disc height at L2-L3. Marked loss of disc height at
L3-L4 and L4-L5. Acquired bony fusion noted across the L5-S1 disc.

Bones are demineralized. There are surgical vascular clips in the
left central abdomen. There is a partly imaged left hip prosthesis.
IMPRESSION: 1. No fracture or acute finding.
2. Degenerative changes as detailed.  Diffuse bone demineralization.
